# Patient Record
Sex: Male | Born: 1960 | Race: White | Hispanic: No | Marital: Single | Smoking: Never smoker
Health system: Southern US, Community
[De-identification: ages and names within clinical notes are randomized; demographics above are authoritative.]

## PROBLEM LIST (undated history)

## (undated) DIAGNOSIS — F79 Unspecified intellectual disabilities: Secondary | ICD-10-CM

## (undated) DIAGNOSIS — G809 Cerebral palsy, unspecified: Secondary | ICD-10-CM

## (undated) DIAGNOSIS — F209 Schizophrenia, unspecified: Secondary | ICD-10-CM

## (undated) DIAGNOSIS — F319 Bipolar disorder, unspecified: Secondary | ICD-10-CM

## (undated) DIAGNOSIS — Q369 Cleft lip, unilateral: Secondary | ICD-10-CM

## (undated) HISTORY — PX: APPENDECTOMY: SHX54

## (undated) HISTORY — PX: CLEFT LIP REPAIR: SUR1164

## (undated) HISTORY — PX: SPLENECTOMY, TOTAL: SHX788

## (undated) HISTORY — PX: TONSILLECTOMY: SUR1361

---

## 2015-07-10 ENCOUNTER — Emergency Department (HOSPITAL_COMMUNITY)
Admission: EM | Admit: 2015-07-10 | Discharge: 2015-07-10 | Disposition: A | Discharge status: Home / Self Care | Payer: Medicare Other | Attending: Emergency Medicine | Admitting: Emergency Medicine

## 2015-07-10 ENCOUNTER — Emergency Department (HOSPITAL_COMMUNITY): Payer: Medicare Other

## 2015-07-10 ENCOUNTER — Encounter (HOSPITAL_COMMUNITY): Payer: Self-pay

## 2015-07-10 ENCOUNTER — Emergency Department (HOSPITAL_BASED_OUTPATIENT_CLINIC_OR_DEPARTMENT_OTHER)
Admit: 2015-07-10 | Discharge: 2015-07-10 | Disposition: A | Discharge status: Home / Self Care | Payer: Medicare Other | Attending: Emergency Medicine | Admitting: Emergency Medicine

## 2015-07-10 DIAGNOSIS — F209 Schizophrenia, unspecified: Secondary | ICD-10-CM | POA: Insufficient documentation

## 2015-07-10 DIAGNOSIS — R600 Localized edema: Secondary | ICD-10-CM

## 2015-07-10 DIAGNOSIS — M79609 Pain in unspecified limb: Secondary | ICD-10-CM

## 2015-07-10 DIAGNOSIS — R0602 Shortness of breath: Secondary | ICD-10-CM | POA: Insufficient documentation

## 2015-07-10 DIAGNOSIS — Z79899 Other long term (current) drug therapy: Secondary | ICD-10-CM | POA: Diagnosis not present

## 2015-07-10 DIAGNOSIS — R110 Nausea: Secondary | ICD-10-CM | POA: Diagnosis not present

## 2015-07-10 DIAGNOSIS — R2243 Localized swelling, mass and lump, lower limb, bilateral: Secondary | ICD-10-CM | POA: Diagnosis present

## 2015-07-10 DIAGNOSIS — R300 Dysuria: Secondary | ICD-10-CM | POA: Insufficient documentation

## 2015-07-10 DIAGNOSIS — F319 Bipolar disorder, unspecified: Secondary | ICD-10-CM | POA: Insufficient documentation

## 2015-07-10 DIAGNOSIS — Z880 Allergy status to penicillin: Secondary | ICD-10-CM | POA: Insufficient documentation

## 2015-07-10 DIAGNOSIS — Z87898 Personal history of other specified conditions: Secondary | ICD-10-CM

## 2015-07-10 HISTORY — DX: Unspecified intellectual disabilities: F79

## 2015-07-10 HISTORY — DX: Cerebral palsy, unspecified: G80.9

## 2015-07-10 HISTORY — DX: Bipolar disorder, unspecified: F31.9

## 2015-07-10 HISTORY — DX: Cleft lip, unilateral: Q36.9

## 2015-07-10 HISTORY — DX: Schizophrenia, unspecified: F20.9

## 2015-07-10 LAB — URINALYSIS, ROUTINE W REFLEX MICROSCOPIC
Bilirubin Urine: NEGATIVE
Glucose, UA: NEGATIVE mg/dL
Hgb urine dipstick: NEGATIVE
Ketones, ur: NEGATIVE mg/dL
LEUKOCYTES UA: NEGATIVE
NITRITE: NEGATIVE
PROTEIN: NEGATIVE mg/dL
SPECIFIC GRAVITY, URINE: 1.009 (ref 1.005–1.030)
UROBILINOGEN UA: 0.2 mg/dL (ref 0.0–1.0)
pH: 7 (ref 5.0–8.0)

## 2015-07-10 LAB — CBC WITH DIFFERENTIAL/PLATELET
BASOS PCT: 1 % (ref 0–1)
Basophils Absolute: 0.1 10*3/uL (ref 0.0–0.1)
EOS ABS: 0.3 10*3/uL (ref 0.0–0.7)
Eosinophils Relative: 3 % (ref 0–5)
HEMATOCRIT: 38.5 % — AB (ref 39.0–52.0)
HEMOGLOBIN: 12.2 g/dL — AB (ref 13.0–17.0)
LYMPHS ABS: 1.8 10*3/uL (ref 0.7–4.0)
Lymphocytes Relative: 18 % (ref 12–46)
MCH: 31.4 pg (ref 26.0–34.0)
MCHC: 31.7 g/dL (ref 30.0–36.0)
MCV: 99 fL (ref 78.0–100.0)
MONOS PCT: 14 % — AB (ref 3–12)
Monocytes Absolute: 1.4 10*3/uL — ABNORMAL HIGH (ref 0.1–1.0)
NEUTROS ABS: 6.4 10*3/uL (ref 1.7–7.7)
NEUTROS PCT: 64 % (ref 43–77)
Platelets: 436 10*3/uL — ABNORMAL HIGH (ref 150–400)
RBC: 3.89 MIL/uL — AB (ref 4.22–5.81)
RDW: 13.8 % (ref 11.5–15.5)
WBC: 10 10*3/uL (ref 4.0–10.5)

## 2015-07-10 LAB — COMPREHENSIVE METABOLIC PANEL
ALT: 67 U/L — AB (ref 17–63)
ANION GAP: 8 (ref 5–15)
AST: 36 U/L (ref 15–41)
Albumin: 4.4 g/dL (ref 3.5–5.0)
Alkaline Phosphatase: 48 U/L (ref 38–126)
BUN: 13 mg/dL (ref 6–20)
CALCIUM: 10.7 mg/dL — AB (ref 8.9–10.3)
CHLORIDE: 107 mmol/L (ref 101–111)
CO2: 25 mmol/L (ref 22–32)
CREATININE: 1.22 mg/dL (ref 0.61–1.24)
Glucose, Bld: 100 mg/dL — ABNORMAL HIGH (ref 65–99)
Potassium: 4.6 mmol/L (ref 3.5–5.1)
SODIUM: 140 mmol/L (ref 135–145)
Total Bilirubin: 0.5 mg/dL (ref 0.3–1.2)
Total Protein: 7.5 g/dL (ref 6.5–8.1)

## 2015-07-10 LAB — BRAIN NATRIURETIC PEPTIDE: B NATRIURETIC PEPTIDE 5: 11.6 pg/mL (ref 0.0–100.0)

## 2015-07-10 MED ORDER — NAPROXEN 250 MG PO TABS: 250.0000 mg | ORAL_TABLET | Freq: Two times a day (BID) | ORAL | Status: AC

## 2015-07-10 NOTE — Discharge Instructions (Signed)
Peripheral Edema You have swelling in your legs (peripheral edema). This swelling is due to excess accumulation of salt and water in your body. Edema may be a sign of heart, kidney or liver disease, or a side effect of a medication. It may also be due to problems in the leg veins. Elevating your legs and using special support stockings may be very helpful, if the cause of the swelling is due to poor venous circulation. Avoid long periods of standing, whatever the cause. Treatment of edema depends on identifying the cause. Chips, pretzels, pickles and other salty foods should be avoided. Restricting salt in your diet is almost always needed. Water pills (diuretics) are often used to remove the excess salt and water from your body via urine. These medicines prevent the kidney from reabsorbing sodium. This increases urine flow. Diuretic treatment may also result in lowering of potassium levels in your body. Potassium supplements may be needed if you have to use diuretics daily. Daily weights can help you keep track of your progress in clearing your edema. You should call your caregiver for follow up care as recommended. SEEK IMMEDIATE MEDICAL CARE IF:   You have increased swelling, pain, redness, or heat in your legs.  You develop shortness of breath, especially when lying down.  You develop chest or abdominal pain, weakness, or fainting.  You have a fever. Document Released: 11/22/2004 Document Revised: 01/07/2012 Document Reviewed: 11/02/2009 [facility] Patient Information 2015 [place], [place]. This information is not intended to replace advice given to you by your health care provider. Make sure you discuss any questions you have with your health care provider. Liver disease  Cirrhosis is a condition of scarring of the liver which is caused when the liver has tried repairing itself following damage. This damage may come from a previous infection such as one of the forms of hepatitis (usually hepatitis  C), or the damage may come from being injured by toxins. The main toxin that causes this damage is alcohol. The scarring of the liver from use of alcohol is irreversible. That means the liver cannot return to normal even though alcohol is not used any more. The main danger of hepatitis C infection is that it may cause long-lasting (chronic) liver disease, and this also may lead to cirrhosis. This complication is progressive and irreversible. CAUSES  Prior to available blood tests, hepatitis C could be contracted by blood transfusions. Since testing of blood has improved, this is now unlikely. This infection can also be contracted through intravenous drug use and the sharing of needles. It can also be contracted through sexual relationships. The injury caused by alcohol comes from too much use. It is not a few drinks that poison the liver, but years of misuse. Usually there will be some signs and symptoms early with scarring of the liver that suggest the development of better habits. Alcohol should never be used while using acetaminophen. A small dose of both taken together may cause irreversible damage to the liver. HOME CARE INSTRUCTIONS  There is no specific treatment for cirrhosis. However, there are things you can do to avoid making the condition worse.  Rest as needed.  Eat a well-balanced diet. Your caregiver can help you with suggestions.  Vitamin supplements including vitamins A, K, D, and thiamine can help.  A low-salt diet, water restriction, or diuretic medicine may be needed to reduce fluid retention.  Avoid alcohol. This can be extremely toxic if combined with acetaminophen.  Avoid drugs which are toxic to  the liver. Some of these include isoniazid, methyldopa, acetaminophen, anabolic steroids (muscle-building drugs), erythromycin, and oral contraceptives (birth control pills). Check with your caregiver to make sure medicines you are presently taking will not be harmful.  Periodic  blood tests may be required. Follow your caregiver's advice regarding the timing of these.  Milk thistle is an herbal remedy which does protect the liver against toxins. However, it will not help once the liver has been scarred. SEEK MEDICAL CARE IF:  You have increasing fatigue or weakness.  You develop swelling of the hands, feet, legs, or face.  You vomit bright red blood, or a coffee ground appearing material.  You have blood in your stools, or the stools turn black and tarry.  You have a fever.  You develop loss of appetite, or have nausea and vomiting.  You develop jaundice.  You develop easy bruising or bleeding.  You have worsening of any of the problems you are concerned about. Document Released: 10/15/2005 Document Revised: 01/07/2012 Document Reviewed: 06/02/2008 [facility] Patient Information 2015 [place], [place]. This information is not intended to replace advice given to you by your health care provider. Make sure you discuss any questions you have with your health care provider. Shortness of Breath Shortness of breath means you have trouble breathing. It could also mean that you have a medical problem. You should get immediate medical care for shortness of breath. CAUSES   Not enough oxygen in the air such as with high altitudes or a smoke-filled room.  Certain lung diseases, infections, or problems.  Heart disease or conditions, such as angina or heart failure.  Low red blood cells (anemia).  Poor physical fitness, which can cause shortness of breath when you exercise.  Chest or back injuries or stiffness.  Being overweight.  Smoking.  Anxiety, which can make you feel like you are not getting enough air. DIAGNOSIS  Serious medical problems can often be found during your physical exam. Tests may also be done to determine why you are having shortness of breath. Tests may include:  Chest X-rays.  Lung function tests.  Blood tests.  An electrocardiogram  (ECG).  An ambulatory electrocardiogram. An ambulatory ECG records your heartbeat patterns over a 24-hour period.  Exercise testing.  A transthoracic echocardiogram (TTE). During echocardiography, sound waves are used to evaluate how blood flows through your heart.  A transesophageal echocardiogram (TEE).  Imaging scans. Your health care provider may not be able to find a cause for your shortness of breath after your exam. In this case, it is important to have a follow-up exam with your health care provider as directed.  TREATMENT  Treatment for shortness of breath depends on the cause of your symptoms and can vary greatly. HOME CARE INSTRUCTIONS   Do not smoke. Smoking is a common cause of shortness of breath. If you smoke, ask for help to quit.  Avoid being around chemicals or things that may bother your breathing, such as paint fumes and dust.  Rest as needed. Slowly resume your usual activities.  If medicines were prescribed, take them as directed for the full length of time directed. This includes oxygen and any inhaled medicines.  Keep all follow-up appointments as directed by your health care provider. SEEK MEDICAL CARE IF:   Your condition does not improve in the time expected.  You have a hard time doing your normal activities even with rest.  You have any new symptoms. SEEK IMMEDIATE MEDICAL CARE IF:   Your shortness of  breath gets worse.  You feel light-headed, faint, or develop a cough not controlled with medicines.  You start coughing up blood.  You have pain with breathing.  You have chest pain or pain in your arms, shoulders, or abdomen.  You have a fever.  You are unable to walk up stairs or exercise the way you normally do. MAKE SURE YOU:  Understand these instructions.  Will watch your condition.  Will get help right away if you are not doing well or get worse. Document Released: 07/10/2001 Document Revised: 10/20/2013 Document Reviewed:  12/31/2011 [facility] Patient Information 2015 [place], [place]. This information is not intended to replace advice given to you by your health care provider. Make sure you discuss any questions you have with your health care provider.

## 2015-07-10 NOTE — ED Notes (Signed)
Pt here with mother and father.  Mother reports that pt has been dealing with edema in LE and feet since July.  Is currently on Keflex, Lasix and Potassium.  Pain and swelling getting worse.  Taking advil with no relief.  Pt is up walking floors crying.

## 2015-07-10 NOTE — ED Provider Notes (Signed)
CSN: [id number]     Arrival date & time 07/10/15  1439 History   First MD Initiated Contact with Patient 07/10/15 1557     Chief Complaint  Patient presents with  . Leg Swelling   Theodore Anderson is a 54 y.o. male with a history of mental retardation, cerebral palsy, bipolar disorder, schizophrenia, and liver disease who presents to the emergency department with his mother and father reported they've been dealing with his bilateral lower extremity edema since July of this year. They reported that the swelling in his right lower extremity is worse than his left. The patient complains of pain in his bilateral legs. They report that he has been to the [facility] emergency department multiple times in the past 4 weeks and he has had ultrasound and CAT scans done of his leg. They report that he is currently taking Cipro for possible cellulitis. Reports he is followed by primary care provider Dr. Oscar La but has not followed up with him this week. The ED placed him on Cipro, and Lasix to help with the swelling. The report of the patient has not been able to go to work due to the pain in his legs. Therefore they have come to [facility] emergency department today because they feel that the [facility] emergency department has not given him the help that he needed. The patient also reports to me that he feels like he becomes short of breath when he lays flat, but he is unsure how long this has been going on for. The patient denies fevers, chills, chest pain, palpitations, vomiting, diarrhea, or history of DVTs or PEs. They report his abdomen is chronically distended due to liver problems. They report he has been acting normally.   (Consider location/radiation/quality/duration/timing/severity/associated sxs/prior Treatment) HPI  Past Medical History  Diagnosis Date  . CP (cerebral palsy)   . MR (mental retardation)   . Cleft lip   . Bipolar affective   . Schizophrenia    Past Surgical History  Procedure Laterality  Date  . Splenectomy, total    . Tonsillectomy    . Appendectomy    . Cleft lip repair     History reviewed. No pertinent family history. Social History  Substance Use Topics  . Smoking status: Never Smoker   . Smokeless tobacco: None  . Alcohol Use: No    Review of Systems  Constitutional: Negative for fever and chills.  HENT: Negative for congestion and sore throat.   Eyes: Negative for visual disturbance.  Respiratory: Positive for shortness of breath. Negative for cough and wheezing.   Cardiovascular: Positive for leg swelling. Negative for chest pain and palpitations.  Gastrointestinal: Positive for nausea. Negative for vomiting, abdominal pain and diarrhea.  Genitourinary: Positive for dysuria (chronic ). Negative for frequency and difficulty urinating.  Musculoskeletal: Negative for back pain and neck pain.  Skin: Positive for rash.  Neurological: Negative for light-headedness and headaches.      Allergies  Penicillins; Olanzapine; Other; and Valproic acid and related  Home Medications   Prior to Admission medications   Medication Sig Start Date End Date Taking? Authorizing Provider  albuterol (PROVENTIL HFA;VENTOLIN HFA) 108 (90 BASE) MCG/ACT inhaler Inhale 2 puffs into the lungs every 6 (six) hours as needed for wheezing or shortness of breath.   Yes Historical Provider, MD  busPIRone (BUSPAR) 10 MG tablet Take 10 mg by mouth 3 (three) times daily.   Yes Historical Provider, MD  cephALEXin (KEFLEX) 500 MG capsule Take 500 mg by mouth  every 6 (six) hours. Started 9/5, for 10 days ending 07/13/15 07/03/15  Yes Historical Provider, MD  cetirizine (ZYRTEC) 10 MG tablet Take 10 mg by mouth daily.   Yes Historical Provider, MD  chlorproMAZINE (THORAZINE) 100 MG tablet Take 100 mg by mouth 4 (four) times daily.   Yes Historical Provider, MD  Cholecalciferol (VITAMIN D) 2000 UNITS CAPS Take 4,000 Units by mouth daily.   Yes Historical Provider, MD  diclofenac (VOLTAREN) 75 MG  EC tablet Take 75 mg by mouth 2 (two) times daily.   Yes Historical Provider, MD  docusate sodium (COLACE) 100 MG capsule Take 100 mg by mouth 3 (three) times daily.   Yes Historical Provider, MD  fenofibrate 160 MG tablet Take 160 mg by mouth daily.   Yes Historical Provider, MD  finasteride (PROSCAR) 5 MG tablet Take 5 mg by mouth daily. 06/06/10  Yes Historical Provider, MD  furosemide (LASIX) 40 MG tablet Take 40 mg by mouth daily. 07/03/15  Yes Historical Provider, MD  gabapentin (NEURONTIN) 300 MG capsule Take 300 mg by mouth 2 (two) times daily.   Yes Historical Provider, MD  KLOR-CON M10 10 MEQ tablet Take 10 mEq by mouth daily. 07/03/15  Yes Historical Provider, MD  lactulose (CHRONULAC) 10 GM/15ML solution Take 20 g by mouth 2 (two) times daily.   Yes Historical Provider, MD  lithium carbonate 300 MG capsule Take 300-600 mg by mouth 2 (two) times daily with a meal. TAKES 1 CAP IN AM AND 2 CAPS AT BEDTIME   Yes Historical Provider, MD  LORazepam (ATIVAN) 0.5 MG tablet Take 0.5 mg by mouth 4 (four) times daily. 06/06/10  Yes Historical Provider, MD  lubiprostone (AMITIZA) 24 MCG capsule Take 24 mcg by mouth 2 (two) times daily with a meal.   Yes Historical Provider, MD  montelukast (SINGULAIR) 10 MG tablet Take 10 mg by mouth daily.   Yes Historical Provider, MD  Multiple Vitamin (MULTIVITAMIN WITH MINERALS) TABS tablet Take 1 tablet by mouth daily.   Yes Historical Provider, MD  naproxen (NAPROSYN) 250 MG tablet Take 1 tablet (250 mg total) by mouth 2 (two) times daily with a meal. 07/10/15   Everlene Farrier, PA-C  OXcarbazepine (TRILEPTAL) 150 MG tablet Take 150 mg by mouth 2 (two) times daily.   Yes Historical Provider, MD  pantoprazole (PROTONIX) 40 MG tablet Take 40 mg by mouth daily.   Yes Historical Provider, MD  simvastatin (ZOCOR) 20 MG tablet Take 20 mg by mouth at bedtime.   Yes Historical Provider, MD  solifenacin (VESICARE) 10 MG tablet Take 10 mg by mouth daily. 06/06/10  Yes Historical  Provider, MD  tamsulosin (FLOMAX) 0.4 MG CAPS capsule Take 0.4 mg by mouth daily after supper.   Yes Historical Provider, MD  topiramate (TOPAMAX) 50 MG tablet Take 50 mg by mouth 2 (two) times daily.   Yes Historical Provider, MD   BP 109/69 mmHg  Pulse 77  Temp(Src) 98.3 F (36.8 C) (Oral)  Resp 22  Ht 5\' 6"  (1.676 m)  Wt 228 lb 6.4 oz (103.602 kg)  BMI 36.88 kg/m2  SpO2 92% Physical Exam  Constitutional: He appears well-developed and well-nourished. No distress.  Nontoxic appearing. Obese male.  HENT:  Head: Normocephalic and atraumatic.  Mouth/Throat: Oropharynx is clear and moist.  Eyes: Conjunctivae are normal. Pupils are equal, round, and reactive to light. Right eye exhibits no discharge. Left eye exhibits no discharge.  Neck: Normal range of motion. Neck supple.  Cardiovascular: Normal rate,  regular rhythm, normal heart sounds and intact distal pulses.  Exam reveals no gallop and no friction rub.   No murmur heard. Bilateral radial, posterior tibialis and dorsalis pedis pulses are intact.    Pulmonary/Chest: Effort normal and breath sounds normal. No respiratory distress. He has no wheezes. He has no rales.  Lungs clear to auscultation bilaterally. The patient is speaking in complete sentences. No respiratory distress.  Abdominal: Soft. Bowel sounds are normal. He exhibits distension. There is no tenderness. There is no guarding.  Abdomen is distended diffusely. Abdomen is soft and nontender to palpation. No fluid wave.  Musculoskeletal: Normal range of motion. He exhibits edema. He exhibits no tenderness.  Patient is spontaneously moving all extremities in a coordinated fashion exhibiting good strength.  Patient has bilateral 2+ pitting edema with his right greater than his left. His right shin there is no area of erythema and slight warmth.  Lymphadenopathy:    He has no cervical adenopathy.  Neurological: He is alert. Coordination normal.  Skin: Skin is warm and dry. No  rash noted. He is not diaphoretic. No erythema. No pallor.  Psychiatric: He has a normal mood and affect. His behavior is normal.  Nursing note and vitals reviewed.   ED Course  Procedures (including critical care time) Labs Review Labs Reviewed  COMPREHENSIVE METABOLIC PANEL - Abnormal; Notable for the following:    Glucose, Bld 100 (*)    Calcium 10.7 (*)    ALT 67 (*)    All other components within normal limits  CBC WITH DIFFERENTIAL/PLATELET - Abnormal; Notable for the following:    RBC 3.89 (*)    Hemoglobin 12.2 (*)    HCT 38.5 (*)    Platelets 436 (*)    Monocytes Relative 14 (*)    Monocytes Absolute 1.4 (*)    All other components within normal limits  BRAIN NATRIURETIC PEPTIDE  URINALYSIS, ROUTINE W REFLEX MICROSCOPIC (NOT AT [facility])    Imaging Review Dg Chest 2 View  07/10/2015   CLINICAL DATA:  Shortness of breath.  EXAM: CHEST  2 VIEW  COMPARISON:  None.  FINDINGS: Mild cardiomegaly is noted. Both lungs are clear. No pneumothorax or pleural effusion is noted. Dextroscoliosis of thoracic spine is noted. Old right rib fractures are noted.  IMPRESSION: No active cardiopulmonary disease.   Electronically Signed   By: Lupita Raider, M.D.   On: 07/10/2015 19:06   I have personally reviewed and evaluated these images and lab results as part of my medical decision-making.   EKG Interpretation   Date/Time:  Sunday July 10 2015 17:29:34 EDT Ventricular Rate:  74 PR Interval:  187 QRS Duration: 137 QT Interval:  462 QTC Calculation: 513 R Axis:   -50 Text Interpretation:  Sinus rhythm Consider left atrial enlargement  Nonspecific IVCD with LAD Confirmed by Lincoln Brigham [id number]) on 07/10/2015  6:32:59 PM      Filed Vitals:   07/10/15 2000 07/10/15 2015 07/10/15 2030 07/10/15 2045  BP: 109/71 106/67 118/71 109/69  Pulse: 74 77 81 77  Temp:      TempSrc:      Resp: 22 15 21 22   Height:      Weight:      SpO2: 95% 98% 93% 92%     MDM   Meds given in  ED:  Medications - No data to display  New Prescriptions   NAPROXEN (NAPROSYN) 250 MG TABLET    Take 1 tablet (250 mg total) by mouth 2 (  two) times daily with a meal.    Final diagnoses:  Bilateral lower extremity edema  Hx of shortness of breath   This is a 54 y.o. male with a history of mental retardation, cerebral palsy, bipolar disorder, schizophrenia, and liver disease who presents to the emergency department with his mother and father reported they've been dealing with his bilateral lower extremity edema since July of this year. They reported that the swelling in his right lower extremity is worse than his left. The patient complains of pain in his bilateral legs. They report that he has been to the [facility] emergency department multiple times in the past 4 weeks and he has had ultrasound and CAT scans done of his leg. Therefore that he is currently taking Cipro for possible cellulitis. Patient also reports that she has had some intermittent shortness of breath when he lies flat. He denies current shortness of breath. He denies chest pain. On exam the patient is afebrile nontoxic appearing. The patient has mild bilateral lower extremity edema with his right mildly greater than his left. There is some mild erythema over his right anterior shin. It does not appear to be cellulitis. Patient's abdomen is chronically distended. His abdomen is nontender to palpation. Patient's bilateral feet are neurovascularly intact. He has good pedal pulses. The patient's urinalysis is unremarkable. BNP is within normal limits. CMP shows mildly elevated ALT at 67 but otherwise is unremarkable. CBC is unremarkable. Chest x-ray shows no active cardiopulmonary disease. Right lower extremity Doppler ultrasound indicated no DVT. I suspect that the patient's lower extremity edema is more related to his history of liver failure. The patient is currently on Lasix. He is also taking Cipro. Advised he could continue taking the  Cipro until he completes his course. I feel the patient would benefit from GI follow-up. I provided information for follow-up with GI. Patient also reports he's been taking ibuprofen which helps with this pain for a short period of time. Will stop ibuprofen and start on a short course of naproxen until he can follow-up with GI. I advised the patient to follow-up with their primary care provider and GI this week. I advised the patient to return to the emergency department with new or worsening symptoms or new concerns. The patient and his family verbalized understanding and agreement with plan.    This patient was discussed with Dr. Madilyn Hook who agrees with assessment and plan.     Everlene Farrier, PA-C 07/10/15 2145  Tilden Fossa, MD 07/11/15 [phone]

## 2015-07-10 NOTE — ED Notes (Signed)
Patient transported to Ultrasound 

## 2015-07-10 NOTE — ED Notes (Signed)
Patient transported to X-ray 

## 2015-07-10 NOTE — Progress Notes (Signed)
VASCULAR LAB PRELIMINARY  PRELIMINARY  PRELIMINARY  PRELIMINARY  Right lower extremity duplex completed.    Preliminary report:  There is no DVT or SVT noted in the right lower extremity.   Daija Routson, RVT 07/10/2015, 6:11 PM

## 2015-07-10 NOTE — ED Notes (Signed)
Discharge instructions reviewed with patient/parents. Understanding verbalized. Patient refused wheelchair at time of discharge. No acute distress noted.

## 2016-04-15 IMAGING — CR DG CHEST 2V
2 series · 2 of 2 positions shown · non-contrast
Comparison: None.

CLINICAL DATA: Shortness of breath.

EXAM:
CHEST  2 VIEW

[chest lat]
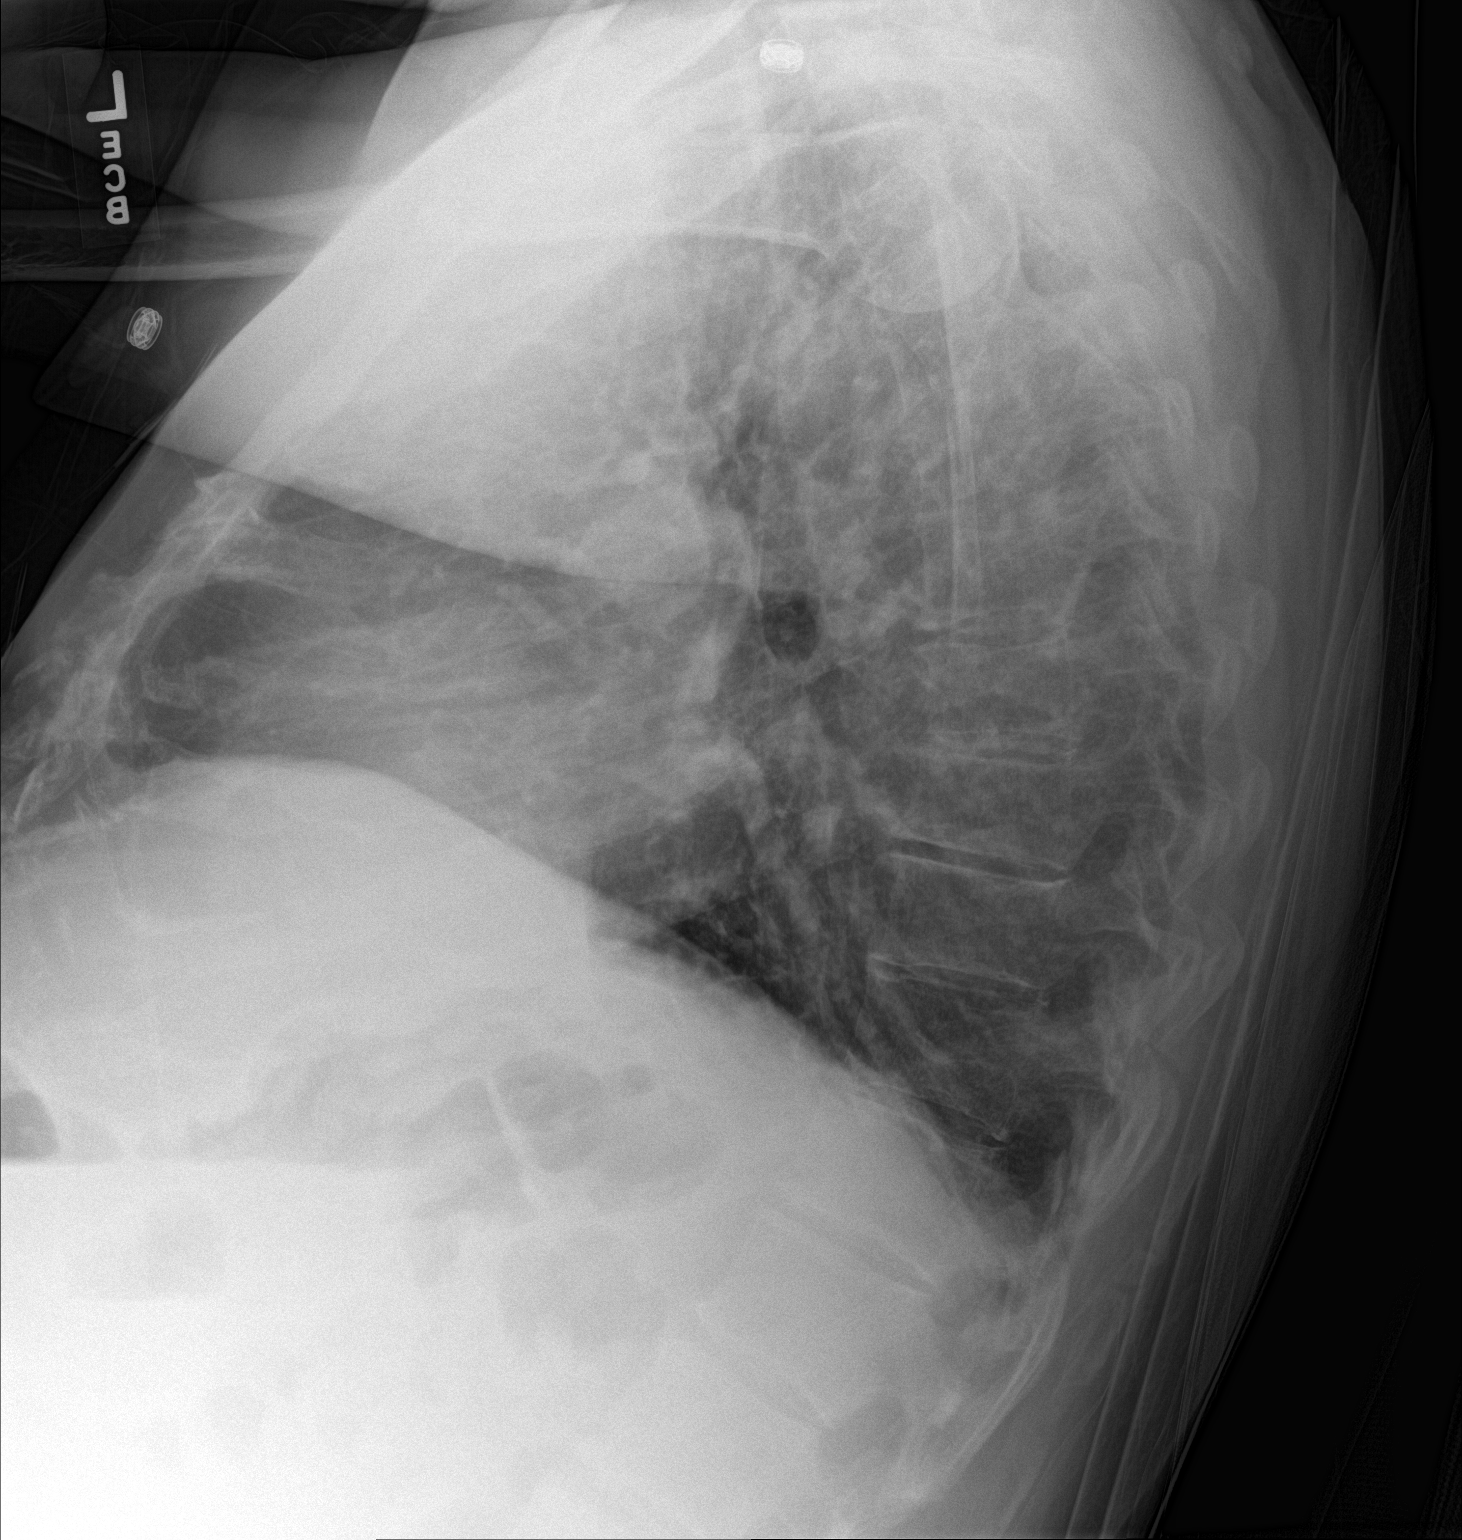

[chest ap]
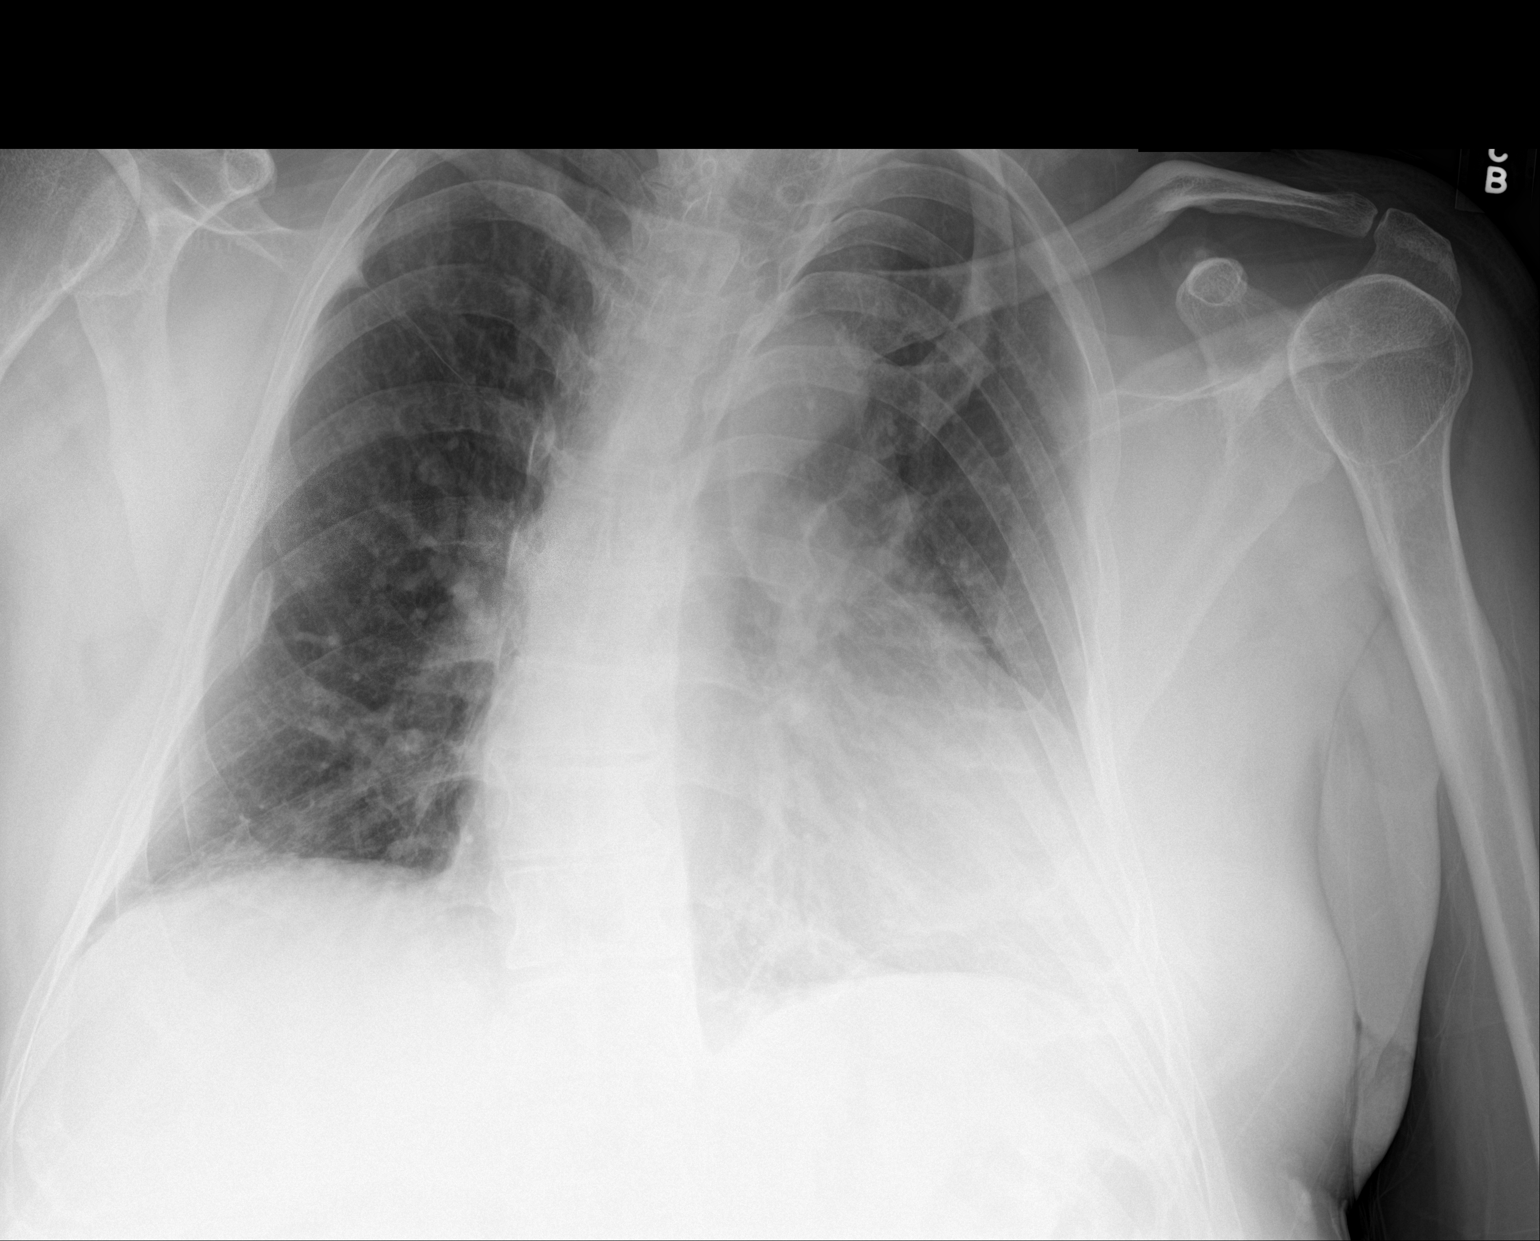

[2 of 2 positions shown; findings below may reference images not displayed]

FINDINGS: Mild cardiomegaly is noted. Both lungs are clear. No pneumothorax or
pleural effusion is noted. Dextroscoliosis of thoracic spine is
noted. Old right rib fractures are noted.
IMPRESSION: No active cardiopulmonary disease.

## 2016-08-29 DEATH — deceased
